# Patient Record
Sex: Female | Born: 2000 | Race: Black or African American | Hispanic: No | Marital: Single | Smoking: Never smoker
Health system: Southern US, Community
[De-identification: ages and names within clinical notes are randomized; demographics above are authoritative.]

## PROBLEM LIST (undated history)

## (undated) DIAGNOSIS — J020 Streptococcal pharyngitis: Secondary | ICD-10-CM

---
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
## 2018-09-11 ENCOUNTER — Encounter (HOSPITAL_COMMUNITY): Payer: Self-pay

## 2018-09-11 ENCOUNTER — Emergency Department (HOSPITAL_COMMUNITY)
Admission: EM | Admit: 2018-09-11 | Discharge: 2018-09-11 | Disposition: A | Discharge status: Home / Self Care | Payer: Self-pay | Attending: Emergency Medicine | Admitting: Emergency Medicine

## 2018-09-11 ENCOUNTER — Other Ambulatory Visit: Payer: Self-pay

## 2018-09-11 DIAGNOSIS — R040 Epistaxis: Secondary | ICD-10-CM | POA: Insufficient documentation

## 2018-09-11 DIAGNOSIS — J0110 Acute frontal sinusitis, unspecified: Secondary | ICD-10-CM | POA: Insufficient documentation

## 2018-09-11 MED ORDER — OXYMETAZOLINE HCL 0.05 % NA SOLN
1.0000 | Freq: Two times a day (BID) | NASAL | Status: DC
  Administered 2018-09-11: 1 via NASAL
  Filled 2018-09-11: qty 15

## 2018-09-11 MED ORDER — KETOROLAC TROMETHAMINE 60 MG/2ML IM SOLN
60.0000 mg | Freq: Once | INTRAMUSCULAR | Status: AC
  Administered 2018-09-11: 60 mg via INTRAMUSCULAR
  Filled 2018-09-11: qty 2

## 2018-09-11 NOTE — ED Notes (Signed)
Bed: [facility] Expected date:  Expected time:  Means of arrival:  Comments: 

## 2018-09-11 NOTE — ED Notes (Signed)
MD at bedside. 

## 2018-09-11 NOTE — ED Notes (Signed)
Nose bleed has subsided.  

## 2018-09-11 NOTE — Discharge Instructions (Signed)
Use the afrin nose spray 2 times a day for 3 days and then stop.  Put vasoline in the inside of the left nostril.  Use saline spray intermittently for congestion and over the counter sinus meds.

## 2018-09-11 NOTE — ED Notes (Signed)
Bed: [facility] Expected date:  Expected time:  Means of arrival:  Comments: 

## 2018-09-11 NOTE — ED Triage Notes (Signed)
Pt arrives via POV. Pt reports a nose bleed that began yesterday and resolved and then began bleeding again this morning. Pt reports that she does not have a hx of nose bleed. Pt reports headache for 2-3 days. Pt reports she went to the beach for [place] and since coming back she has been sick with a sinus infection.

## 2018-09-11 NOTE — ED Provider Notes (Signed)
[facility]-EMERGENCY DEPT Provider Note   CSN: [id number] Arrival date & time: 09/11/18  1106     History   Chief Complaint Chief Complaint  Patient presents with  . Epistaxis    HPI Isabella Brewer is a 18 y.o. female.  Patient is a healthy 18 year old female presenting today with URI symptoms for the last 3 days.  She states she has had a headache over the right eye, fullness in her ears, nasal congestion and discharge.  She has felt feverish and chilled.  No coughing or sore throat.  Yesterday with blowing her nose she had some minimal bleeding from the left nostril but this morning when she blew her nose it was bleeding heavily.  It has stopped now spontaneously.  She denies significant history of recurrent nosebleeds.  She does not take any medications.  She does not use nasal sprays.  The history is provided by the patient.  Epistaxis   This is a new problem. The current episode started yesterday. The problem occurs daily. The problem has been resolved. Associated with: 3 days of congestion and URI symptoms and having to blow her nose more frequently. The bleeding has been from the left nare. She has tried nothing for the symptoms. The treatment provided significant relief. Her past medical history is significant for colds. Her past medical history does not include nose-picking or frequent nosebleeds.    Past Medical History:  Diagnosis Date  . Strep throat     There are no active problems to display for this patient.   History reviewed. No pertinent surgical history.   OB History   No obstetric history on file.      Home Medications    Prior to Admission medications   Not on File    Family History History reviewed. No pertinent family history.  Social History Social History   Tobacco Use  . Smoking status: Never Smoker  . Smokeless tobacco: Never Used  Substance Use Topics  . Alcohol use: Yes    Frequency: Never    Comment: occ  .  Drug use: Yes    Types: Marijuana    Comment: occ     Allergies   Patient has no known allergies.   Review of Systems Review of Systems  HENT: Positive for nosebleeds.   All other systems reviewed and are negative.    Physical Exam Updated Vital Signs BP 127/86   Pulse (!) 108   Temp 99.4 F (37.4 C) (Oral)   Resp 16   Ht 5\' 7"  (1.702 m)   Wt 97.5 kg   SpO2 98%   BMI 33.67 kg/m   Physical Exam Vitals signs and nursing note reviewed.  Constitutional:      General: She is not in acute distress.    Appearance: Normal appearance. She is normal weight.  HENT:     Head: Normocephalic.     Right Ear: A middle ear effusion is present.     Left Ear: A middle ear effusion is present.     Nose: Mucosal edema and rhinorrhea present.      Mouth/Throat:     Pharynx: No pharyngeal swelling, oropharyngeal exudate or posterior oropharyngeal erythema.     Tonsils: No tonsillar exudate.  Neck:     Musculoskeletal: Normal range of motion. Neck rigidity present. No muscular tenderness.  Cardiovascular:     Rate and Rhythm: Normal rate.  Pulmonary:     Effort: Pulmonary effort is normal.  Lymphadenopathy:  Cervical: No cervical adenopathy.  Skin:    General: Skin is warm and dry.     Capillary Refill: Capillary refill takes less than 2 seconds.  Neurological:     General: No focal deficit present.     Mental Status: She is alert. Mental status is at baseline.  Psychiatric:        Mood and Affect: Mood normal.        Behavior: Behavior normal.      ED Treatments / Results  Labs (all labs ordered are listed, but only abnormal results are displayed) Labs Reviewed - No data to display  EKG None  Radiology No results found.  Procedures Procedures (including critical care time)  Medications Ordered in ED Medications  oxymetazoline (AFRIN) 0.05 % nasal spray 1 spray (has no administration in time range)  ketorolac (TORADOL) injection 60 mg (has no  administration in time range)     Initial Impression / Assessment and Plan / ED Course  I have reviewed the triage vital signs and the nursing notes.  Pertinent labs & imaging results that were available during my care of the patient were reviewed by me and considered in my medical decision making (see chart for details).     Pt with symptoms consistent with viral URI/sinusitis.  Well appearing here.  No signs of breathing difficulty  No signs of pharyngitis, otitis or abnormal abdominal findings.  Patient was having epistaxis but has stopped spontaneously.  Small ulcer noted to the nasal septum and will have patient use Afrin twice daily for 3 days as well as Vaseline and saline spray.  She was also instructed to use over-the-counter sinus decongestant and drink plenty of fluids. Pt to return with any further problems.\  Final Clinical Impressions(s) / ED Diagnoses   Final diagnoses:  Left-sided epistaxis  Acute non-recurrent frontal sinusitis    ED Discharge Orders    None       Gwyneth Sprout, MD 09/11/18 1319

## 2021-06-28 ENCOUNTER — Encounter (HOSPITAL_COMMUNITY): Payer: Self-pay | Admitting: Emergency Medicine

## 2021-06-28 ENCOUNTER — Emergency Department (HOSPITAL_COMMUNITY)
Admission: EM | Admit: 2021-06-28 | Discharge: 2021-06-28 | Disposition: A | Discharge status: Home / Self Care | Payer: Self-pay | Attending: Emergency Medicine | Admitting: Emergency Medicine

## 2021-06-28 ENCOUNTER — Other Ambulatory Visit: Payer: Self-pay

## 2021-06-28 ENCOUNTER — Emergency Department (HOSPITAL_COMMUNITY): Payer: Self-pay

## 2021-06-28 DIAGNOSIS — R079 Chest pain, unspecified: Secondary | ICD-10-CM | POA: Insufficient documentation

## 2021-06-28 DIAGNOSIS — R519 Headache, unspecified: Secondary | ICD-10-CM | POA: Insufficient documentation

## 2021-06-28 DIAGNOSIS — Z20822 Contact with and (suspected) exposure to covid-19: Secondary | ICD-10-CM | POA: Insufficient documentation

## 2021-06-28 DIAGNOSIS — Z5321 Procedure and treatment not carried out due to patient leaving prior to being seen by health care provider: Secondary | ICD-10-CM | POA: Insufficient documentation

## 2021-06-28 DIAGNOSIS — R0602 Shortness of breath: Secondary | ICD-10-CM | POA: Insufficient documentation

## 2021-06-28 LAB — CBC WITH DIFFERENTIAL/PLATELET
Abs Immature Granulocytes: 0.01 10*3/uL (ref 0.00–0.07)
Basophils Absolute: 0 10*3/uL (ref 0.0–0.1)
Basophils Relative: 0 %
Eosinophils Absolute: 0 10*3/uL (ref 0.0–0.5)
Eosinophils Relative: 0 %
HCT: 43.3 % (ref 36.0–46.0)
Hemoglobin: 14.5 g/dL (ref 12.0–15.0)
Immature Granulocytes: 0 %
Lymphocytes Relative: 46 %
Lymphs Abs: 4.5 10*3/uL — ABNORMAL HIGH (ref 0.7–4.0)
MCH: 30 pg (ref 26.0–34.0)
MCHC: 33.5 g/dL (ref 30.0–36.0)
MCV: 89.6 fL (ref 80.0–100.0)
Monocytes Absolute: 0.5 10*3/uL (ref 0.1–1.0)
Monocytes Relative: 5 %
Neutro Abs: 4.8 10*3/uL (ref 1.7–7.7)
Neutrophils Relative %: 49 %
Platelets: 284 10*3/uL (ref 150–400)
RBC: 4.83 MIL/uL (ref 3.87–5.11)
RDW: 12.6 % (ref 11.5–15.5)
WBC: 9.8 10*3/uL (ref 4.0–10.5)
nRBC: 0 % (ref 0.0–0.2)

## 2021-06-28 LAB — RESP PANEL BY RT-PCR (FLU A&B, COVID) ARPGX2
Influenza A by PCR: NEGATIVE
Influenza B by PCR: NEGATIVE
SARS Coronavirus 2 by RT PCR: NEGATIVE

## 2021-06-28 LAB — COMPREHENSIVE METABOLIC PANEL
ALT: 14 U/L (ref 0–44)
AST: 16 U/L (ref 15–41)
Albumin: 4 g/dL (ref 3.5–5.0)
Alkaline Phosphatase: 64 U/L (ref 38–126)
Anion gap: 11 (ref 5–15)
BUN: 9 mg/dL (ref 6–20)
CO2: 23 mmol/L (ref 22–32)
Calcium: 9 mg/dL (ref 8.9–10.3)
Chloride: 101 mmol/L (ref 98–111)
Creatinine, Ser: 0.84 mg/dL (ref 0.44–1.00)
GFR, Estimated: >60 mL/min (ref >60)
Glucose, Bld: 101 mg/dL — ABNORMAL HIGH (ref 70–99)
Potassium: 3.3 mmol/L — ABNORMAL LOW (ref 3.5–5.1)
Sodium: 135 mmol/L (ref 135–145)
Total Bilirubin: 0.6 mg/dL (ref 0.3–1.2)
Total Protein: 7.6 g/dL (ref 6.5–8.1)

## 2021-06-28 LAB — LIPASE, BLOOD: Lipase: 33 U/L (ref 11–51)

## 2021-06-28 LAB — TROPONIN I (HIGH SENSITIVITY): Troponin I (High Sensitivity): <2 ng/L (ref <18)

## 2021-06-28 NOTE — ED Triage Notes (Signed)
Patient reports intermittent central chest pain for several days worse when lying down , mild SOB , no cough or fever , denies emesis or diaphoresis , patient added mild headache .

## 2021-06-28 NOTE — ED Provider Notes (Signed)
Emergency Medicine Provider Triage Evaluation Note  Isabella Brewer , a 20 y.o. female  was evaluated in triage.  Pt complains of Chest pain and headache since last night.  She denies abdominal pain.  She reports that her whole head hurts on both sides but mostly on bilateral temples. Chest pain is centralized She feels short of breath when she gets up.    Review of Systems  Positive: Chest pain, headache,  Negative: Syncope  Physical Exam  BP 124/68 (BP Location: Right Arm)   Pulse 74   Temp 99.7 F (37.6 C) (Oral)   Resp 20   Ht 5\' 7"  (1.702 m)   Wt 110 kg   LMP 05/29/2021   SpO2 100%   BMI 37.98 kg/m  Gen:   Awake, no distress   Resp:  Normal effort  MSK:   Moves extremities without difficulty  Other:  Awake and alert, normal speech.   Medical Decision Making  Medically screening exam initiated at 7:39 PM.  Appropriate orders placed.  05/31/2021 was informed that the remainder of the evaluation will be completed by another provider, this initial triage assessment does not replace that evaluation, and the importance of remaining in the ED until their evaluation is complete.  Note: Portions of this report may have been transcribed using voice recognition software. Every effort was made to ensure accuracy; however, inadvertent computerized transcription errors may be present    Eulah Pont 06/28/21 1944    06/30/21, MD 06/28/21 2257

## 2021-06-28 NOTE — ED Notes (Addendum)
Patient complained of increased chest pain and said "it feels hard to breath". Their O2 was taken again and is 100 currently

## 2022-09-26 IMAGING — DX DG CHEST 2V
2 series · 2 of 2 positions shown · non-contrast
Comparison: No prior radiographs available for comparison.

CLINICAL DATA: Chest pain, shortness of breath

EXAM:
CHEST - 2 VIEW

[w chest pa]
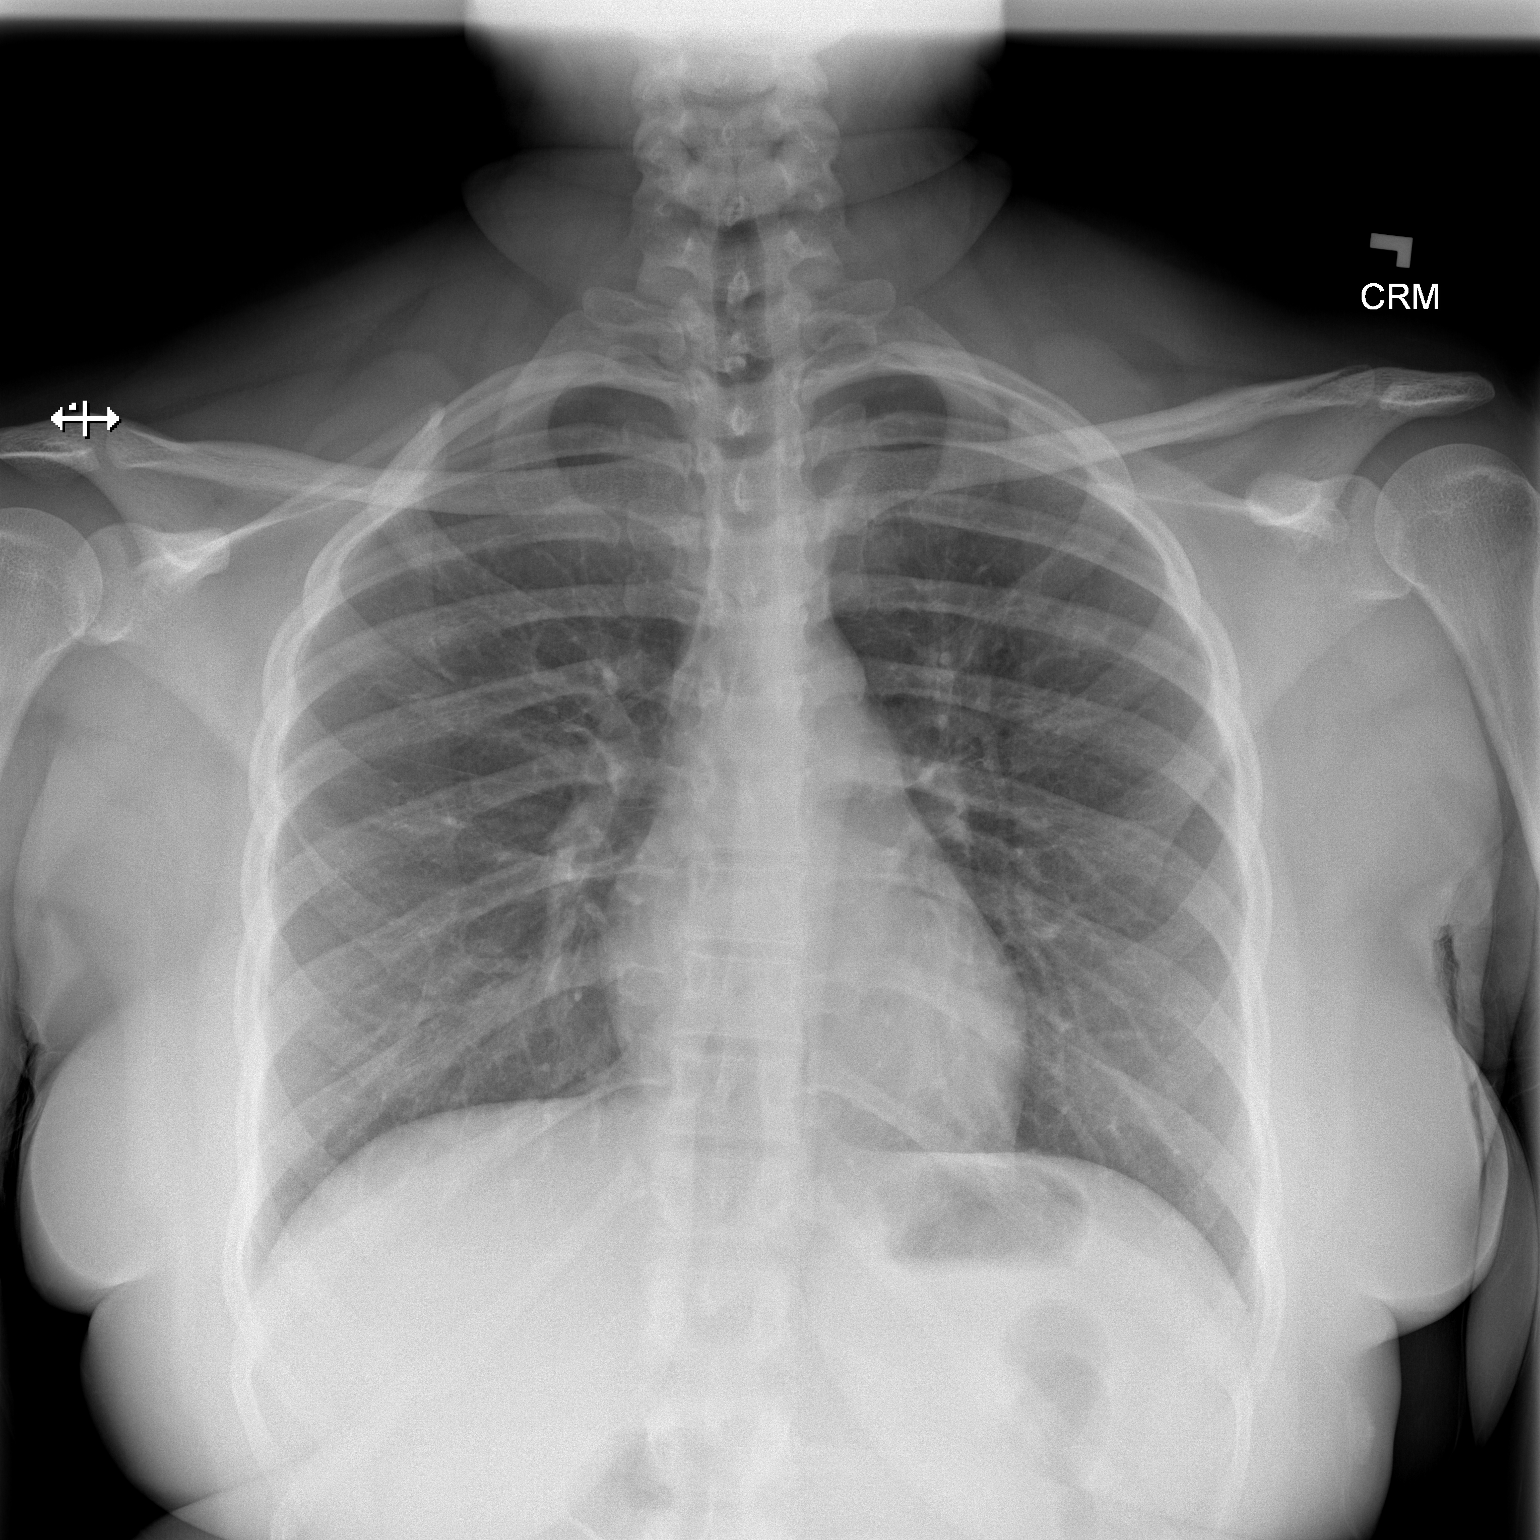

[w chest lat]
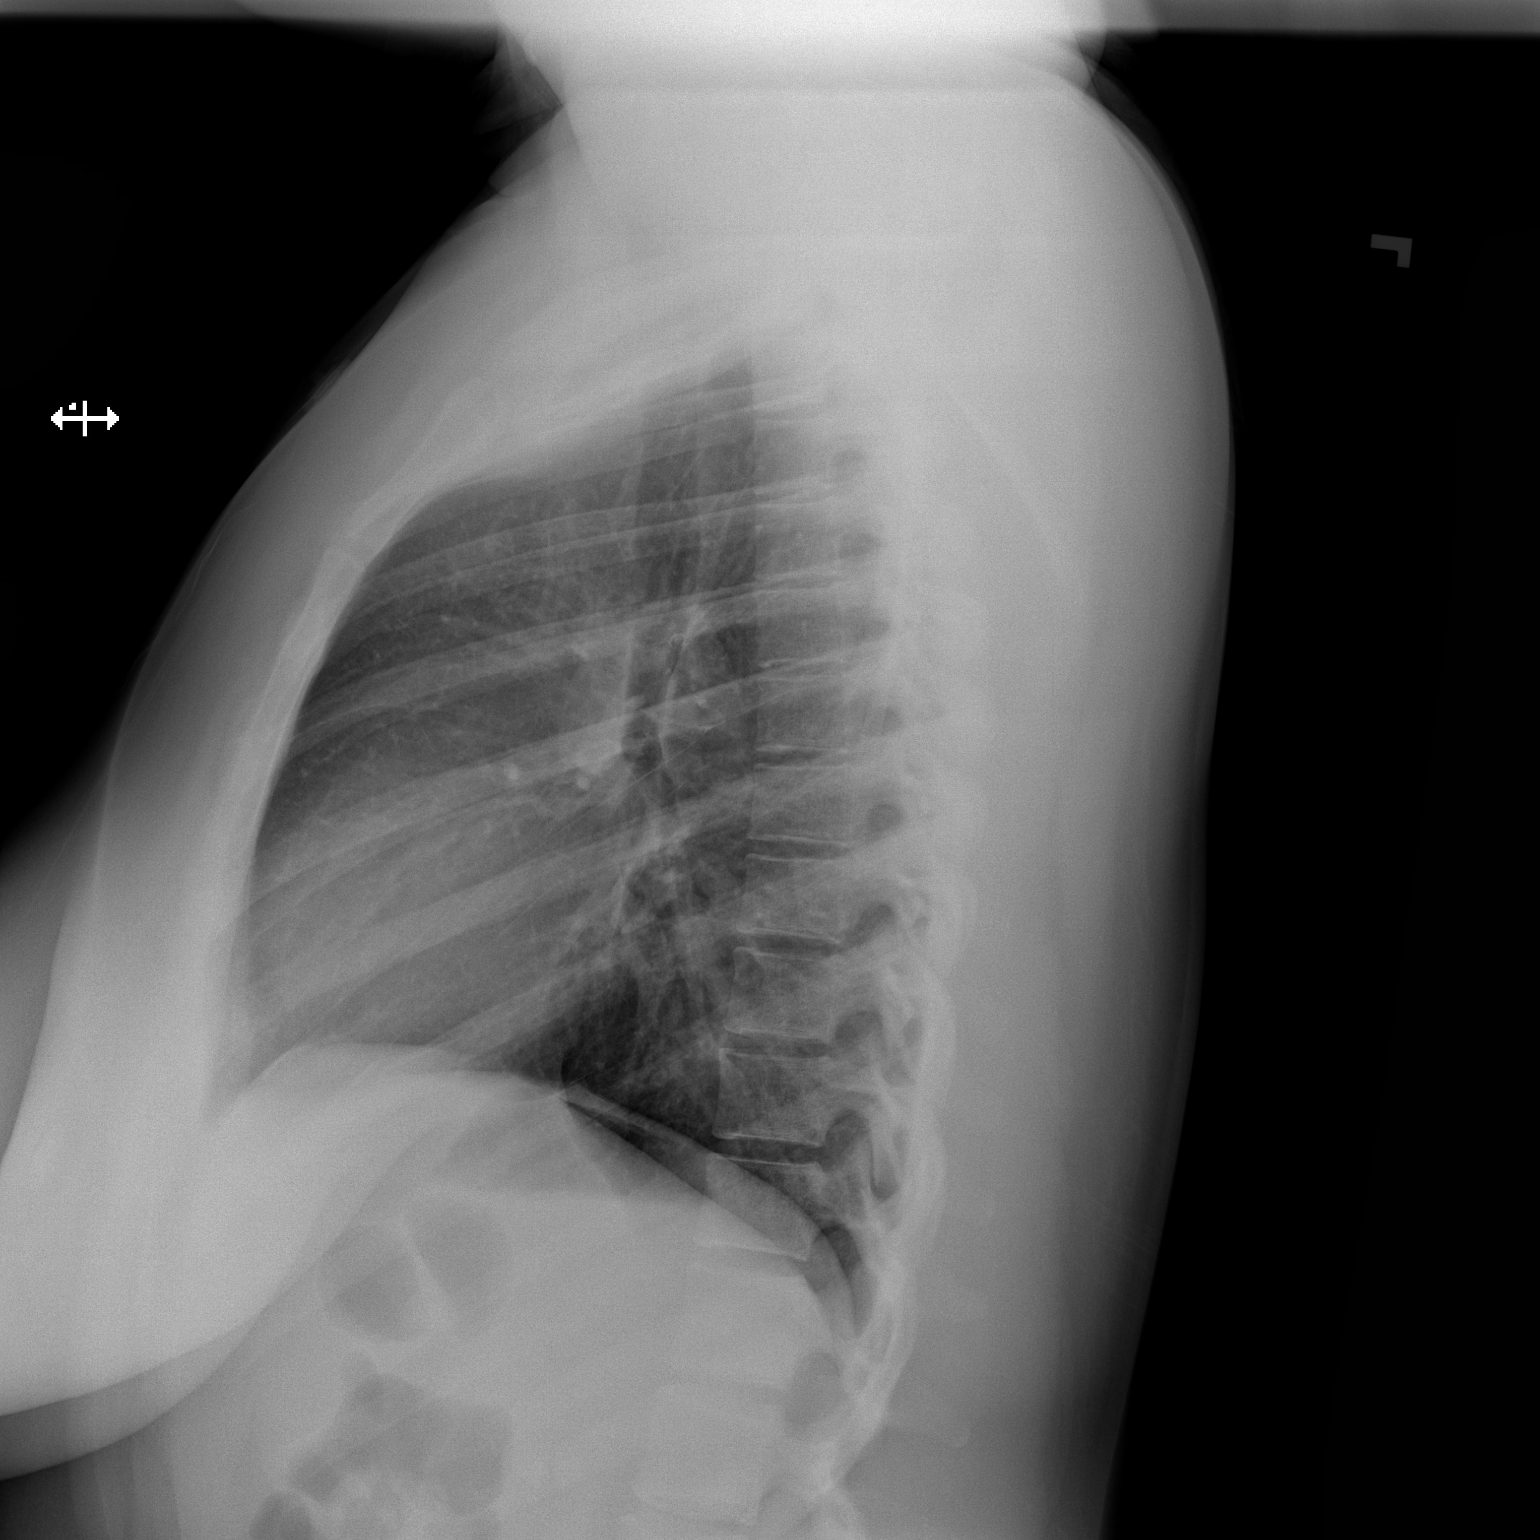

[2 of 2 positions shown; findings below may reference images not displayed]

FINDINGS: Cardiac and mediastinal contours are within normal limits. No focal
pulmonary opacity. No pleural effusion or pneumothorax. No acute
osseous abnormality.
IMPRESSION: No acute cardiopulmonary process.

## 2024-09-17 ENCOUNTER — Emergency Department (HOSPITAL_BASED_OUTPATIENT_CLINIC_OR_DEPARTMENT_OTHER)
Admission: EM | Admit: 2024-09-17 | Discharge: 2024-09-17 | Disposition: A | Discharge status: Home / Self Care | Payer: Self-pay | Attending: Emergency Medicine | Admitting: Emergency Medicine

## 2024-09-17 DIAGNOSIS — J101 Influenza due to other identified influenza virus with other respiratory manifestations: Secondary | ICD-10-CM | POA: Insufficient documentation

## 2024-09-17 DIAGNOSIS — J020 Streptococcal pharyngitis: Secondary | ICD-10-CM | POA: Insufficient documentation

## 2024-09-17 DIAGNOSIS — E860 Dehydration: Secondary | ICD-10-CM | POA: Insufficient documentation

## 2024-09-17 LAB — CBC WITH DIFFERENTIAL/PLATELET
Abs Immature Granulocytes: 0.14 K/uL — ABNORMAL HIGH (ref 0.00–0.07)
Basophils Absolute: 0 K/uL (ref 0.0–0.1)
Basophils Relative: 0 %
Eosinophils Absolute: 0 K/uL (ref 0.0–0.5)
Eosinophils Relative: 0 %
HCT: 36.3 % (ref 36.0–46.0)
Hemoglobin: 12.7 g/dL (ref 12.0–15.0)
Immature Granulocytes: 1 %
Lymphocytes Relative: 4 %
Lymphs Abs: 0.7 K/uL (ref 0.7–4.0)
MCH: 30.5 pg (ref 26.0–34.0)
MCHC: 35 g/dL (ref 30.0–36.0)
MCV: 87.3 fL (ref 80.0–100.0)
Monocytes Absolute: 1.1 K/uL — ABNORMAL HIGH (ref 0.1–1.0)
Monocytes Relative: 6 %
Neutro Abs: 16.8 K/uL — ABNORMAL HIGH (ref 1.7–7.7)
Neutrophils Relative %: 89 %
Platelets: 274 K/uL (ref 150–400)
RBC: 4.16 MIL/uL (ref 3.87–5.11)
RDW: 14.2 % (ref 11.5–15.5)
WBC: 18.7 K/uL — ABNORMAL HIGH (ref 4.0–10.5)
nRBC: 0 % (ref 0.0–0.2)

## 2024-09-17 LAB — BASIC METABOLIC PANEL WITH GFR
Anion gap: 14 (ref 5–15)
BUN: 7 mg/dL (ref 6–20)
CO2: 23 mmol/L (ref 22–32)
Calcium: 9.5 mg/dL (ref 8.9–10.3)
Chloride: 103 mmol/L (ref 98–111)
Creatinine, Ser: 0.84 mg/dL (ref 0.44–1.00)
GFR, Estimated: >60 mL/min
Glucose, Bld: 117 mg/dL — ABNORMAL HIGH (ref 70–99)
Potassium: 3.4 mmol/L — ABNORMAL LOW (ref 3.5–5.1)
Sodium: 140 mmol/L (ref 135–145)

## 2024-09-17 LAB — RESP PANEL BY RT-PCR (RSV, FLU A&B, COVID)  RVPGX2
Influenza A by PCR: POSITIVE — AB
Influenza B by PCR: NEGATIVE
Resp Syncytial Virus by PCR: NEGATIVE
SARS Coronavirus 2 by RT PCR: NEGATIVE

## 2024-09-17 LAB — HCG, QUANTITATIVE, PREGNANCY: hCG, Beta Chain, Quant, S: <1 m[IU]/mL

## 2024-09-17 LAB — GROUP A STREP BY PCR: Group A Strep by PCR: DETECTED — AB

## 2024-09-17 MED ORDER — LACTATED RINGERS IV BOLUS
1000.0000 mL | Freq: Once | INTRAVENOUS | Status: AC
  Administered 2024-09-17: 1000 mL via INTRAVENOUS

## 2024-09-17 MED ORDER — ACETAMINOPHEN 160 MG/5ML PO SOLN
650.0000 mg | Freq: Once | ORAL | Status: AC
  Administered 2024-09-17: 650 mg via ORAL
  Filled 2024-09-17: qty 20.3

## 2024-09-17 MED ORDER — OXYCODONE HCL 5 MG PO TABS: 5.0000 mg | ORAL_TABLET | Freq: Four times a day (QID) | ORAL | 0 refills | Status: AC | PRN

## 2024-09-17 MED ORDER — KETOROLAC TROMETHAMINE 15 MG/ML IJ SOLN
15.0000 mg | Freq: Once | INTRAMUSCULAR | Status: AC
  Administered 2024-09-17: 15 mg via INTRAVENOUS
  Filled 2024-09-17: qty 1

## 2024-09-17 MED ORDER — DEXAMETHASONE SOD PHOSPHATE PF 10 MG/ML IJ SOLN
10.0000 mg | Freq: Once | INTRAMUSCULAR | Status: AC
  Administered 2024-09-17: 10 mg via INTRAVENOUS
  Filled 2024-09-17: qty 1

## 2024-09-17 MED ORDER — AMOXICILLIN 400 MG/5ML PO SUSR: 1000.0000 mg | Freq: Every day | ORAL | 0 refills | Status: AC

## 2024-09-17 MED ORDER — AMOXICILLIN 400 MG/5ML PO SUSR
1000.0000 mg | Freq: Once | ORAL | Status: AC
  Administered 2024-09-17: 1000 mg via ORAL
  Filled 2024-09-17: qty 15

## 2024-09-17 MED ORDER — PREDNISONE 20 MG PO TABS: 40.0000 mg | ORAL_TABLET | Freq: Every day | ORAL | 0 refills | Status: AC

## 2024-09-17 NOTE — ED Triage Notes (Addendum)
 Patient states cough, congestion, and sore throat for several days. Swelling noted to right neck. Hoarse voice.

## 2024-09-17 NOTE — Discharge Instructions (Signed)
 Please read and follow all provided instructions.  Your diagnoses today include:  1. Influenza A   2. Acute streptococcal pharyngitis   3. Dehydration     Tests performed today include: Strep test: was POSITIVE for strep throat Flu test: Was positive for influenza type A Blood cell counts and electrolytes: Your white blood cell count was high due to your infection Vital signs. See below for your results today.   Medications prescribed:  Amoxicillin  - antibiotic  You have been prescribed an antibiotic medicine: take the entire course of medicine even if you are feeling better. Stopping early can cause the antibiotic not to work.  Prednisone  - steroid medicine   It is best to take this medication in the morning to prevent sleeping problems. If you are diabetic, monitor your blood sugar closely and stop taking Prednisone  if blood sugar is over 300. Take with food to prevent stomach upset.   Oxycodone  - narcotic pain medication  DO NOT drive or perform any activities that require you to be awake and alert because this medicine can make you drowsy.   Take any medications prescribed only as directed.   Home care instructions:  Please read the educational materials provided and follow any instructions contained in this packet.  Follow-up instructions: Please follow-up with your primary care provider as needed for further evaluation of your symptoms.  Return instructions:  Please return to the Emergency Department if you experience worsening symptoms.  Return if you have worsening problems swallowing, your neck becomes swollen, you cannot swallow your saliva or your voice becomes muffled.  Return with high persistent fever, persistent vomiting, or if you have trouble breathing.  Please return if you have any other emergent concerns.  Additional Information:  Your vital signs today were: BP 134/75 (BP Location: Left Arm)   Pulse (!) 104   Temp 100.3 F (37.9 C) (Oral)   Resp 16    SpO2 99%  If your blood pressure (BP) was elevated above 135/85 this visit, please have this repeated by your doctor within one month. --------------

## 2024-09-17 NOTE — ED Provider Notes (Signed)
 " [place] [facility] AT [facility] Provider Note   CSN: [id number] Arrival date & time: 09/17/24  9163     Patient presents with: Fever   Isabella Brewer is a 24 y.o. female.   Patient presents to the emergency department today for evaluation of sore throat.  Symptoms started about 2 days ago and have worsened.  Patient reports sore throat, body aches, cough.  Voice has been muffled.  She has been eating and drinking less.  She states that she did have an episode of vomiting.  No known sick contacts.       Prior to Admission medications  Not on File    Allergies: Patient has no known allergies.    Review of Systems  Updated Vital Signs BP 131/86   Pulse (!) 128   Temp (!) 100.8 F (38.2 C) (Oral)   Resp 16   SpO2 98%   Physical Exam Vitals and nursing note reviewed.  Constitutional:      Appearance: She is well-developed.  HENT:     Head: Normocephalic and atraumatic.     Jaw: No trismus.     Right Ear: Tympanic membrane, ear canal and external ear normal.     Left Ear: Tympanic membrane, ear canal and external ear normal.     Nose: Congestion present. No mucosal edema or rhinorrhea.     Mouth/Throat:     Mouth: Mucous membranes are moist. Mucous membranes are not dry. No oral lesions.     Pharynx: Uvula midline. Oropharyngeal exudate and posterior oropharyngeal erythema present. No uvula swelling.     Tonsils: No tonsillar abscesses. 4+ on the right. 4+ on the left.     Comments: Uvula midline, muffled voice.  Patient spitting into an emesis bag at times. Eyes:     General:        Right eye: No discharge.        Left eye: No discharge.     Conjunctiva/sclera: Conjunctivae normal.  Cardiovascular:     Rate and Rhythm: Regular rhythm. Tachycardia present.     Heart sounds: Normal heart sounds.  Pulmonary:     Effort: Pulmonary effort is normal. No respiratory distress.     Breath sounds: Normal breath sounds. No wheezing or rales.   Abdominal:     Palpations: Abdomen is soft.     Tenderness: There is no abdominal tenderness.  Musculoskeletal:     Cervical back: Normal range of motion and neck supple.  Lymphadenopathy:     Cervical: No cervical adenopathy.  Skin:    General: Skin is warm and dry.  Neurological:     Mental Status: She is alert.  Psychiatric:        Mood and Affect: Mood normal.     (all labs ordered are listed, but only abnormal results are displayed) Labs Reviewed  RESP PANEL BY RT-PCR (RSV, FLU A&B, COVID)  RVPGX2 - Abnormal; Notable for the following components:      Result Value   Influenza A by PCR POSITIVE (*)    All other components within normal limits  GROUP A STREP BY PCR - Abnormal; Notable for the following components:   Group A Strep by PCR DETECTED (*)    All other components within normal limits  CBC WITH DIFFERENTIAL/PLATELET - Abnormal; Notable for the following components:   WBC 18.7 (*)    Neutro Abs 16.8 (*)    Monocytes Absolute 1.1 (*)    Abs Immature Granulocytes 0.14 (*)  All other components within normal limits  BASIC METABOLIC PANEL WITH GFR - Abnormal; Notable for the following components:   Potassium 3.4 (*)    Glucose, Bld 117 (*)    All other components within normal limits  HCG, QUANTITATIVE, PREGNANCY    EKG: None  Radiology: No results found.   Procedures   Medications Ordered in the ED  lactated ringers  bolus 1,000 mL (has no administration in time range)  dexamethasone  (DECADRON ) injection 10 mg (has no administration in time range)  ketorolac  (TORADOL ) 15 MG/ML injection 15 mg (has no administration in time range)  acetaminophen  (TYLENOL ) 160 MG/5ML solution 650 mg (has no administration in time range)   ED Course  Patient seen and examined. History obtained directly from patient.   Labs/EKG: Ordered CBC, BMP, strep, viral panel, pregnancy.  Imaging: None ordered.  Considered CT imaging of the neck however currently symptoms most  consistent with tonsillitis/pharyngitis.  Medications/Fluids: Ordered: IV fluid bolus, IV Toradol , IV Decadron .  Will give p.o. Tylenol  for fever.  Most recent vital signs reviewed and are as follows: BP 131/86   Pulse (!) 128   Temp (!) 100.8 F (38.2 C) (Oral)   Resp 16   SpO2 98%   Initial impression: Patient with what appears to be clinically a severe tonsillitis.  Uvula appears midline and do not see any fullness to either side to suggest a peritonsillar abscess.  Patient's voice is muffled.  She is not tripoding.  She will need IV fluids and symptom control.  10:21 AM Reassessment performed. Patient appears stable.  IV fluids running in.  Heart rate still around 120.  Labs personally reviewed and interpreted including: CBC with elevated white blood cell count at 18.7; BMP potassium 3.4, glucose 117 otherwise unremarkable; pregnancy negative; viral panel positive for influenza type A; strep testing positive for strep.  Reviewed pertinent lab work and imaging with patient at bedside. Questions answered.   Most current vital signs reviewed and are as follows: BP 131/86   Pulse (!) 128   Temp (!) 100.8 F (38.2 C) (Oral)   Resp 16   SpO2 98%   Plan: Continuing to hydrate, if she continues to be tachycardic, will order additional fluids.  She will need amoxicillin  for her strep throat.  12:28 PM Reassessment performed. Patient continuing to improve.  Patient states that her throat is feeling a lot better.  Her heart rate when I walked in the room was 102.  Second liter of IV fluids was ordered as patient has not yet urinated.  Reviewed pertinent lab work and imaging with patient at bedside. Questions answered.   Most current vital signs reviewed and are as follows: BP 109/78   Pulse 98   Temp 100.3 F (37.9 C) (Oral)   Resp 16   SpO2 100%   Plan: Anticipate discharge to home.  I would like the patient to urinate prior to discharge to prove that she is adequately  hydrated.  1:25 PM Reassessment performed. Patient appears stable.  She is lying prone on the bed resting without any distress.  Nearly done second liter.  Heart rate low 90s when I walk in the room.  Most current vital signs reviewed and are as follows: BP 134/75 (BP Location: Left Arm)   Pulse (!) 104   Temp 100.3 F (37.9 C) (Oral)   Resp 16   SpO2 99%   Plan: Discharge to home.   Prescriptions written for: Oral amoxicillin , p.o. oxycodone  # 6 tablets to  use for severe pain, 4 additional days of prednisone   Other home care instructions discussed: Maintain good hydration, use of Tylenol /ibuprofen for fever and pain  ED return instructions discussed: Return with worsening, difficulty breathing or swallowing, uncontrolled pain.  Follow-up instructions discussed: Patient encouraged to follow-up with their PCP in 3 days if not improving.                                  Medical Decision Making Amount and/or Complexity of Data Reviewed Labs: ordered.  Risk OTC drugs. Prescription drug management.   In regards to the patient's sore throat today, the following dangerous and potentially life threatening etiologies were considered on the differential diagnosis: Lugwig's angina, uvulitis, epiglottis, peritonsillar abscess, retropharyngeal abscess, Lemierre's syndrome. Also considered were more common causes such as: streptococcal pharyngitis, gonococcal pharyngitis, non-bacterial pharyngitis (cold viruses, HSV/coxsackievirus, influenza, COVID-19, infectious mononucleosis, oropharyngeal candidiasis), and other non-infectious causes including seasonal allergies/post-nasal drip, GERD/esophagitis, trauma.   Given severe throat symptoms, patient was treated with Toradol , IV Decadron  with improvement.  She was also well-hydrated.  Patient presents with flulike symptoms.  Testing positive for influenza.  Patient is well-appearing.  Vital signs are stable, fevers controlled.  Patient treated  for dehydration with improvement clinically.  Considered secondary infection such as pneumonia, however lungs are clear on exam and patient without hypoxia.  Low concern for sepsis.  In addition, low clinical concern for mononucleosis.   Given well appearance, supportive therapy indicated currently.  Discussed signs and symptoms to return with patient at bedside.  Patient seems reliable to return as needed.     Final diagnoses:  Influenza A  Acute streptococcal pharyngitis  Dehydration    ED Discharge Orders          Ordered    predniSONE  (DELTASONE ) 20 MG tablet  Daily        09/17/24 1320    oxyCODONE  (OXY IR/ROXICODONE ) 5 MG immediate release tablet  Every 6 hours PRN        09/17/24 1320    amoxicillin  (AMOXIL ) 400 MG/5ML suspension  Daily        09/17/24 1320               Desiderio Chew, [place] 09/17/24 1328  "

## 2024-09-17 NOTE — ED Notes (Signed)
 Pt discharged home after verbalizing understanding of discharge instructions; nad noted.
